# Patient Record
Sex: Male | Born: 2000 | Race: White | Hispanic: No | Marital: Single | Smoking: Current every day smoker
Health system: Southern US, Community
[De-identification: ages and names within clinical notes are randomized; demographics above are authoritative.]

## PROBLEM LIST (undated history)

## (undated) HISTORY — PX: WRIST SURGERY: SHX841

---
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
## 2018-09-27 ENCOUNTER — Ambulatory Visit (HOSPITAL_COMMUNITY)
Admission: EM | Admit: 2018-09-27 | Discharge: 2018-09-27 | Disposition: A | Discharge status: Home / Self Care | Payer: Medicaid Other | Attending: Family Medicine | Admitting: Family Medicine

## 2018-09-27 ENCOUNTER — Telehealth: Payer: Self-pay | Admitting: *Deleted

## 2018-09-27 ENCOUNTER — Other Ambulatory Visit: Payer: Self-pay

## 2018-09-27 ENCOUNTER — Encounter (HOSPITAL_COMMUNITY): Payer: Self-pay

## 2018-09-27 DIAGNOSIS — Z20822 Contact with and (suspected) exposure to covid-19: Secondary | ICD-10-CM

## 2018-09-27 DIAGNOSIS — R6889 Other general symptoms and signs: Secondary | ICD-10-CM | POA: Diagnosis present

## 2018-09-27 DIAGNOSIS — R112 Nausea with vomiting, unspecified: Secondary | ICD-10-CM | POA: Diagnosis not present

## 2018-09-27 DIAGNOSIS — J029 Acute pharyngitis, unspecified: Secondary | ICD-10-CM

## 2018-09-27 DIAGNOSIS — F419 Anxiety disorder, unspecified: Secondary | ICD-10-CM

## 2018-09-27 DIAGNOSIS — R509 Fever, unspecified: Secondary | ICD-10-CM | POA: Diagnosis not present

## 2018-09-27 LAB — POCT RAPID STREP A: Streptococcus, Group A Screen (Direct): NEGATIVE

## 2018-09-27 MED ORDER — HYDROXYZINE HCL 25 MG PO TABS: 25.0000 mg | ORAL_TABLET | Freq: Four times a day (QID) | ORAL | 0 refills | Status: AC | PRN

## 2018-09-27 MED ORDER — DOXYCYCLINE HYCLATE 100 MG PO CAPS: 100.0000 mg | ORAL_CAPSULE | Freq: Two times a day (BID) | ORAL | 0 refills | Status: AC

## 2018-09-27 MED ORDER — BENZONATATE 200 MG PO CAPS: 200.0000 mg | ORAL_CAPSULE | Freq: Three times a day (TID) | ORAL | 0 refills | Status: AC | PRN

## 2018-09-27 MED ORDER — ONDANSETRON 4 MG PO TBDP: 4.0000 mg | ORAL_TABLET | Freq: Three times a day (TID) | ORAL | 0 refills | Status: AC | PRN

## 2018-09-27 NOTE — ED Provider Notes (Signed)
[facility]    CSN: [id number] Arrival date & time: 09/27/18  1104      History   Chief Complaint Chief Complaint  Patient presents with  . Sore Throat  . Nausea  . Fever  . Emesis    HPI Andrew Koch is a 18 y.o. male no contributing past medical history presenting today for evaluation of URI symptoms, GI upset and fevers.  Patient has had nausea, vomiting, body aches and chills for the past 10 days-2 weeks.  He has also had associated congestion cough and sore throat.  He denies significant shortness of breath.  Has had some intermittent chest discomfort.  He has not checked his fever at home, but has had subjective fevers.  Denies any known sick contacts or known exposure to COVID.  He has been using ibuprofen 800 mg which does help his symptoms, but then they returned once ibuprofen wears off.  Recently took ibuprofen prior to arrival.  He does not take anything else for his symptoms.  Patient does admit to vaping nicotine, but denies underlying asthma or COPD.  HPI  History reviewed. No pertinent past medical history.  There are no active problems to display for this patient.   Past Surgical History:  Procedure Laterality Date  . WRIST SURGERY         Home Medications    Prior to Admission medications   Medication Sig Start Date End Date Taking? Authorizing Provider  benzonatate (TESSALON) 200 MG capsule Take 1 capsule (200 mg total) by mouth 3 (three) times daily as needed for up to 7 days for cough. 09/27/18 10/04/18  ,  C, PA-C  doxycycline (VIBRAMYCIN) 100 MG capsule Take 1 capsule (100 mg total) by mouth 2 (two) times daily. 09/27/18   ,  C, PA-C  hydrOXYzine (ATARAX/VISTARIL) 25 MG tablet Take 1 tablet (25 mg total) by mouth every 6 (six) hours as needed for anxiety. 09/27/18   ,  C, PA-C  ondansetron (ZOFRAN-ODT) 4 MG disintegrating tablet Take 1 tablet (4 mg total) by mouth every 8 (eight) hours as needed for  nausea or vomiting. 09/27/18   ,  C, PA-C  prochlorperazine (COMPAZINE) 10 MG tablet Take 1 tablet (10 mg total) by mouth every 6 (six) hours as needed for nausea or vomiting. 12/21/17 09/27/18  Willy Eddyobinson, Patrick, MD    Family History Family History  Adopted: Yes  Problem Relation Age of Onset  . Healthy Mother   . Diabetes Father     Social History Social History   Tobacco Use  . Smoking status: Current Every Day Smoker    Types: E-cigarettes  . Smokeless tobacco: Never Used  Substance Use Topics  . Alcohol use: Never    Frequency: Never  . Drug use: Never     Allergies   Patient has no known allergies.   Review of Systems Review of Systems  Constitutional: Positive for chills and fatigue. Negative for activity change, appetite change and fever.  HENT: Positive for congestion, rhinorrhea and sore throat. Negative for ear pain, sinus pressure and trouble swallowing.   Eyes: Negative for discharge and redness.  Respiratory: Positive for cough. Negative for chest tightness and shortness of breath.   Cardiovascular: Negative for chest pain.  Gastrointestinal: Positive for nausea and vomiting. Negative for abdominal pain and diarrhea.  Musculoskeletal: Positive for back pain, myalgias and neck pain.  Skin: Negative for rash.  Neurological: Negative for dizziness, light-headedness and headaches.     Physical Exam  Triage Vital Signs ED Triage Vitals  Enc Vitals Group     BP 09/27/18 1155 (!) 134/99     Pulse Rate 09/27/18 1155 89     Resp 09/27/18 1155 20     Temp 09/27/18 1155 100 F (37.8 C)     Temp Source 09/27/18 1155 Oral     SpO2 09/27/18 1155 100 %     Weight --      Height --      Head Circumference --      Peak Flow --      Pain Score 09/27/18 1151 2     Pain Loc --      Pain Edu? --      Excl. in [place]? --    No data found.  Updated Vital Signs BP (!) 134/99 (BP Location: Left Arm)   Pulse 89   Temp 100 F (37.8 C) (Oral)   Resp 20    SpO2 100%   Visual Acuity Right Eye Distance:   Left Eye Distance:   Bilateral Distance:    Right Eye Near:   Left Eye Near:    Bilateral Near:     Physical Exam Vitals signs and nursing note reviewed.  Constitutional:      Appearance: He is well-developed.  HENT:     Head: Normocephalic and atraumatic.     Ears:     Comments: Bilateral ears without tenderness to palpation of external auricle, tragus and mastoid, EAC's without erythema or swelling, TM's with good bony landmarks and cone of light. Non erythematous.     Mouth/Throat:     Comments: Oral mucosa pink and moist, no tonsillar enlargement or exudate. Posterior pharynx patent and nonerythematous, no uvula deviation or swelling. Normal phonation.  Eyes:     Conjunctiva/sclera: Conjunctivae normal.  Neck:     Musculoskeletal: Neck supple.  Cardiovascular:     Rate and Rhythm: Normal rate and regular rhythm.     Heart sounds: No murmur.  Pulmonary:     Effort: Pulmonary effort is normal. No respiratory distress.     Breath sounds: Normal breath sounds.     Comments: Breathing comfortably at rest, CTABL, no wheezing, rales or other adventitious sounds auscultated Abdominal:     Palpations: Abdomen is soft.     Tenderness: There is no abdominal tenderness.     Comments: Nontender to palpation throughout abdomen  Skin:    General: Skin is warm.     Comments: Skin feels warm and sweating underneath his sweatshirt  Neurological:     Mental Status: He is alert.      UC Treatments / Results  Labs (all labs ordered are listed, but only abnormal results are displayed) Labs Reviewed  CULTURE, GROUP A STREP [facility])  POCT RAPID STREP A    EKG None  Radiology No results found.  Procedures Procedures (including critical care time)  Medications Ordered in UC Medications - No data to display  Initial Impression / Assessment and Plan / UC Course  I have reviewed the triage vital signs and the nursing notes.   Pertinent labs & imaging results that were available during my care of the patient were reviewed by me and considered in my medical decision making (see chart for details).     Strep test negative.  Patient with URI symptoms, GI upset and fever.  No tachycardia or hypoxia, lungs clear, no significant reported shortness of breath.  Will defer chest x-ray.  Symptoms most likely COVID.  Will set up testing.  Will recommend continued symptomatic and supportive care with anti-inflammatories for fever and body aches.  Tessalon as needed for cough.  Given length of symptoms will go ahead and cover for atypicals with doxycycline.  Zofran as needed for nausea and vomiting, push fluids, slowly transition diet.  Do not suspect underlying abdominal emergency at this time.Discussed strict return precautions. Patient verbalized understanding and is agreeable with plan.  Hydroxyzine to use as needed for anxiety.  Final Clinical Impressions(s) / UC Diagnoses   Final diagnoses:  Suspected Covid-19 Virus Infection     Discharge Instructions     Someone will be in contact with you to set up COVID testing, the address is [address] Please remain quarantined at home until results return  Please continue Tylenol and ibuprofen for fevers, body aches and headache Tessalon as needed for cough every 8 hours Doxycycline twice daily for 10 days to cover atypical respiratory infections Drink plenty of fluids- pedialyte, gatorade Use Zofran every 8 hours as needed for nausea and vomiting, slowly transition diet, avoid spicy and greasy foods  Please follow-up if developing worsening cough, fevers continuing to persist and developing shortness of breath or chest pain    ED Prescriptions    Medication Sig Dispense Auth. Provider   ondansetron (ZOFRAN-ODT) 4 MG disintegrating tablet Take 1 tablet (4 mg total) by mouth every 8 (eight) hours as needed for nausea or vomiting. 20 tablet ,  C, PA-C    benzonatate (TESSALON) 200 MG capsule Take 1 capsule (200 mg total) by mouth 3 (three) times daily as needed for up to 7 days for cough. 28 capsule ,  C, PA-C   hydrOXYzine (ATARAX/VISTARIL) 25 MG tablet Take 1 tablet (25 mg total) by mouth every 6 (six) hours as needed for anxiety. 12 tablet ,  C, PA-C   doxycycline (VIBRAMYCIN) 100 MG capsule Take 1 capsule (100 mg total) by mouth 2 (two) times daily. 20 capsule ,  C, PA-C     Controlled Substance Prescriptions [place] Controlled Substance Registry consulted? Not Applicable   Lew Dawes,  C, [place]-C 09/27/18 1306

## 2018-09-27 NOTE — Telephone Encounter (Signed)
-----   Message from Janith Lima, PA-C sent at 09/27/2018 12:28 PM EDT ----- Regarding: Needs COVID testing URI symptoms, cough, fever, nausea, vomiting body aches x 10 days, no known exposure

## 2018-09-27 NOTE — ED Triage Notes (Signed)
Patient presents to Urgent Care with complaints of sore throat, nausea, vomiting, chills since about 10 days ago. Patient reports he took 800mg  ibuprofen this morning around 0930. Pt states he has not been around anyone else that has been sick, has not been tested for COVID.

## 2018-09-27 NOTE — Telephone Encounter (Signed)
Patient scheduled for Covid-19 testing today @ 1:30pm @ the [facility] in Home. Instructions given.

## 2018-09-27 NOTE — Discharge Instructions (Addendum)
Someone will be in contact with you to set up COVID testing, the address is [place]. Please remain quarantined at home until results return  Please continue Tylenol and ibuprofen for fevers, body aches and headache Tessalon as needed for cough every 8 hours Doxycycline twice daily for 10 days to cover atypical respiratory infections Drink plenty of fluids- pedialyte, gatorade Use Zofran every 8 hours as needed for nausea and vomiting, slowly transition diet, avoid spicy and greasy foods  Please follow-up if developing worsening cough, fevers continuing to persist and developing shortness of breath or chest pain

## 2018-09-29 LAB — CULTURE, GROUP A STREP (THRC)

## 2018-09-29 LAB — NOVEL CORONAVIRUS, NAA: SARS-CoV-2, NAA: NOT DETECTED

## 2018-09-30 ENCOUNTER — Telehealth (HOSPITAL_COMMUNITY): Payer: Self-pay | Admitting: Emergency Medicine

## 2018-09-30 NOTE — Telephone Encounter (Signed)
Your test for COVID-19 was negative.  Please continue good preventive care measures, including:  frequent hand-washing, avoid touching your face, cover coughs/sneezes, stay out of crowds and keep a 6 foot distance from others.  If you develop fever/cough/breathlessness, please stay home for 10 days and until you have had 3 consecutive days with cough/breathlessness improving and without fever (without taking a fever reducer). Go to the nearest hospital ED tent for assessment if fever/cough/breathlessness are severe or illness seems like a threat to life.  Patient contacted and made aware of all results, all questions answered.  Pt stil having cough, had not picked up tessalon. Encouraged to pick that medicine up and to call back Monday if its not working.

## 2018-12-30 ENCOUNTER — Emergency Department (HOSPITAL_COMMUNITY)
Admission: EM | Admit: 2018-12-30 | Discharge: 2018-12-30 | Disposition: A | Discharge status: Home / Self Care | Payer: Medicaid Other | Attending: Emergency Medicine | Admitting: Emergency Medicine

## 2018-12-30 ENCOUNTER — Emergency Department (HOSPITAL_COMMUNITY): Payer: Medicaid Other

## 2018-12-30 DIAGNOSIS — N433 Hydrocele, unspecified: Secondary | ICD-10-CM | POA: Insufficient documentation

## 2018-12-30 DIAGNOSIS — N50811 Right testicular pain: Secondary | ICD-10-CM | POA: Diagnosis present

## 2018-12-30 DIAGNOSIS — N5089 Other specified disorders of the male genital organs: Secondary | ICD-10-CM | POA: Insufficient documentation

## 2018-12-30 DIAGNOSIS — F17210 Nicotine dependence, cigarettes, uncomplicated: Secondary | ICD-10-CM | POA: Diagnosis not present

## 2018-12-30 LAB — URINALYSIS, ROUTINE W REFLEX MICROSCOPIC
Bilirubin Urine: NEGATIVE
Glucose, UA: NEGATIVE mg/dL
Hgb urine dipstick: NEGATIVE
Ketones, ur: NEGATIVE mg/dL
Leukocytes,Ua: NEGATIVE
Nitrite: NEGATIVE
Protein, ur: NEGATIVE mg/dL
Specific Gravity, Urine: 1.025 (ref 1.005–1.030)
pH: 5 (ref 5.0–8.0)

## 2018-12-30 MED ORDER — NAPROXEN 500 MG PO TABS: 500.0000 mg | ORAL_TABLET | Freq: Two times a day (BID) | ORAL | 0 refills | Status: AC

## 2018-12-30 NOTE — Discharge Instructions (Signed)
I have prescribed a short course of anti-inflammatories, please take 1 tablet twice a day for the next 7 days.  The number to Dr. Oval Linsey urology is attached to your chart, please schedule an appointment for follow-up of your hydrocele.  If you experience any worsening pain, fevers, abdominal pain please return to the emergency department.

## 2018-12-30 NOTE — ED Triage Notes (Signed)
Pt sent here for [place] for right testicular swelling onset one week ago with dysuria. Reports was tested for STD's earlier this week with negative results.

## 2018-12-30 NOTE — ED Provider Notes (Signed)
[facility] EMERGENCY DEPARTMENT Provider Note   CSN: [id number] Arrival date & time: 12/30/18  0840     History   Chief Complaint Chief Complaint  Patient presents with  . Groin Swelling    HPI Andrew Koch is a 18 y.o. male.     18 y.o male with no PMH presents to the ED with a chief complaint of right testicular pain x1 week.  Patient was seen at [facility] urgent care earlier today was tested for gonorrhea and chlamydia with negative results.  He reports he was sent into the ED for further evaluation with ultrasound to rule out any acute emergency.  He reports he has had right testicular swelling for the past week, some penile swelling, no changes in his discharge.  He has not taken any medication for relieving symptoms.  Patient reports the pain is worse when sitting down, states that he was sitting out in the lobby which made his pain worse.  There are no alleviating factors.  He denies any fevers, penile discharge, prior sexual transmitted infections or abdominal pain or urinary symptoms.  The history is provided by the patient.    Past Surgical History:  Procedure Laterality Date  . WRIST SURGERY          Home Medications    Prior to Admission medications   Medication Sig Start Date End Date Taking? Authorizing Provider  doxycycline (VIBRAMYCIN) 100 MG capsule Take 1 capsule (100 mg total) by mouth 2 (two) times daily. 09/27/18   Wieters, Hallie C, PA-C  hydrOXYzine (ATARAX/VISTARIL) 25 MG tablet Take 1 tablet (25 mg total) by mouth every 6 (six) hours as needed for anxiety. 09/27/18   Wieters, Hallie C, PA-C  naproxen (NAPROSYN) 500 MG tablet Take 1 tablet (500 mg total) by mouth 2 (two) times daily for 7 days. 12/30/18 01/06/19  Claude Manges, PA-C  ondansetron (ZOFRAN-ODT) 4 MG disintegrating tablet Take 1 tablet (4 mg total) by mouth every 8 (eight) hours as needed for nausea or vomiting. 09/27/18   Wieters, Hallie C, PA-C  prochlorperazine  (COMPAZINE) 10 MG tablet Take 1 tablet (10 mg total) by mouth every 6 (six) hours as needed for nausea or vomiting. 12/21/17 09/27/18  Willy Eddy, MD    Family History Family History  Adopted: Yes  Problem Relation Age of Onset  . Healthy Mother   . Diabetes Father     Social History Social History   Tobacco Use  . Smoking status: Current Every Day Smoker    Types: E-cigarettes  . Smokeless tobacco: Never Used  Substance Use Topics  . Alcohol use: Never    Frequency: Never  . Drug use: Never     Allergies   Patient has no known allergies.   Review of Systems Review of Systems  Constitutional: Negative for chills and fever.  HENT: Negative for ear pain and sore throat.   Eyes: Negative for pain and visual disturbance.  Respiratory: Negative for cough and shortness of breath.   Cardiovascular: Negative for chest pain and palpitations.  Gastrointestinal: Negative for abdominal pain and vomiting.  Genitourinary: Positive for penile swelling and scrotal swelling. Negative for decreased urine volume, dysuria, hematuria and testicular pain.  Musculoskeletal: Negative for arthralgias and back pain.  Skin: Negative for color change and rash.  Neurological: Negative for seizures and syncope.  All other systems reviewed and are negative.    Physical Exam Updated Vital Signs BP (!) 127/59 (BP Location: Left Arm)  Pulse 91   Temp 98.5 F (36.9 C) (Oral)   Resp 18   SpO2 99%   Physical Exam Vitals signs and nursing note reviewed. Exam conducted with a chaperone present.  Constitutional:      Appearance: He is well-developed.  HENT:     Head: Normocephalic and atraumatic.  Eyes:     General: No scleral icterus.    Pupils: Pupils are equal, round, and reactive to light.  Neck:     Musculoskeletal: Normal range of motion.  Cardiovascular:     Heart sounds: Normal heart sounds.  Pulmonary:     Effort: Pulmonary effort is normal.     Breath sounds: Normal  breath sounds. No wheezing.  Chest:     Chest wall: No tenderness.  Abdominal:     General: Bowel sounds are normal. There is no distension.     Palpations: Abdomen is soft.     Tenderness: There is no abdominal tenderness.  Genitourinary:    Penis: Circumcised. No paraphimosis, erythema, discharge or lesions.      Scrotum/Testes:        Right: Tenderness, swelling and testicular hydrocele present. Varicocele not present. Right testis is descended.        Left: Swelling, testicular hydrocele or varicocele not present. Left testis is descended.     Epididymis:     Right: Normal.     Left: Normal.     Comments: Chaperone present.  Pain with palpation of the right testicle, mild swelling noted to the area.  Left testicular region appears unremarkable. Musculoskeletal:        General: No tenderness or deformity.  Skin:    General: Skin is warm and dry.  Neurological:     Mental Status: He is alert and oriented to person, place, and time.      ED Treatments / Results  Labs (all labs ordered are listed, but only abnormal results are displayed) Labs Reviewed  URINALYSIS, ROUTINE W REFLEX MICROSCOPIC    EKG None  Radiology [place] Scrotum W/doppler  Result Date: 12/30/2018 CLINICAL DATA:  Right scrotal region swelling swelling EXAM: SCROTAL ULTRASOUND DOPPLER ULTRASOUND OF THE TESTICLES TECHNIQUE: Complete ultrasound examination of the testicles, epididymis, and other scrotal structures was performed. Color and spectral Doppler ultrasound were also utilized to evaluate blood flow to the testicles. COMPARISON:  None. FINDINGS: Right testicle Measurements: 4.7 x 2.0 x 3.3 cm. No mass or microlithiasis visualized. Left testicle Measurements: 4.9 x 2.7 x 3.0 cm. No mass or microlithiasis visualized. Right epididymis:  Normal in size and appearance. Left epididymis:  Normal in size and appearance. Hydrocele: There is a small hydrocele on the right. There is no appreciable hydrocele on the left.  Varicocele:  None visualized. Pulsed Doppler interrogation of both testes demonstrates normal low resistance arterial and venous waveforms bilaterally. No evidence scrotal wall thickening or abscess. IMPRESSION: Small right sided hydrocele. Study otherwise unremarkable. No evidence of orchitis or epididymitis on either side. No testicular mass or testicular torsion on either side. Electronically Signed   By: Bretta BangWilliam  Woodruff III M.D.   On: 12/30/2018 09:35    Procedures Procedures (including critical care time)  Medications Ordered in ED Medications - No data to display   Initial Impression / Assessment and Plan / ED Course  I have reviewed the triage vital signs and the nursing notes.  Pertinent labs & imaging results that were available during my care of the patient were reviewed by me and considered in my medical decision  making (see chart for details).    Patient with no past medical history presents to the ED with complaint of right scrotal swelling, the symptoms began about a week ago.  He reports worsening of symptoms, seen at urgent care this morning at [facility] and sent to the ED for further evaluation with ultrasound.  Of note, patient had negative gonorrhea and Chlamydia testing.  UA in the ED revealed no nitrites, leukocytes, trichomonas.  An ultrasound of his scrotum was obtained which showed:  Small right sided hydrocele. Study otherwise unremarkable. No  evidence of orchitis or epididymitis on either side. No testicular  mass or testicular torsion on either side.   These results were discussed with patient, he is nontoxic-appearing, ambulating with slight discomfort.  No fevers, prior history of hydroceles or varicoceles.  Will give him referral for Dr. Thurmond Butts of urology to follow-up on an outpatient basis.  He will also go home on a short course of anti-inflammatories to help with swelling.  Patient understands and agrees with management at this time, return precautions  provided at length.  Portions of this note were generated with Lobby[employer]. Dictation errors may occur despite best attempts at proofreading.  Final Clinical Impressions(s) / ED Diagnoses   Final diagnoses:  Scrotal swelling  Right hydrocele    ED Discharge Orders         Ordered    naproxen (NAPROSYN) 500 MG tablet  2 times daily     12/30/18 1202           Janeece Fitting, PA-C 12/30/18 1221    Sherwood Gambler, MD 12/30/18 1529

## 2019-10-10 ENCOUNTER — Encounter (HOSPITAL_COMMUNITY): Payer: Self-pay | Admitting: Emergency Medicine

## 2019-10-10 ENCOUNTER — Emergency Department (HOSPITAL_COMMUNITY)
Admission: EM | Admit: 2019-10-10 | Discharge: 2019-10-10 | Discharge status: Against Medical Advice | Payer: Medicaid Other | Attending: Emergency Medicine | Admitting: Emergency Medicine

## 2019-10-10 DIAGNOSIS — F329 Major depressive disorder, single episode, unspecified: Secondary | ICD-10-CM | POA: Diagnosis not present

## 2019-10-10 DIAGNOSIS — Z5321 Procedure and treatment not carried out due to patient leaving prior to being seen by health care provider: Secondary | ICD-10-CM | POA: Diagnosis not present

## 2019-10-10 LAB — COMPREHENSIVE METABOLIC PANEL
ALT: 35 U/L (ref 0–44)
AST: 24 U/L (ref 15–41)
Albumin: 4.5 g/dL (ref 3.5–5.0)
Alkaline Phosphatase: 56 U/L (ref 38–126)
Anion gap: 8 (ref 5–15)
BUN: 9 mg/dL (ref 6–20)
CO2: 25 mmol/L (ref 22–32)
Calcium: 9.6 mg/dL (ref 8.9–10.3)
Chloride: 110 mmol/L (ref 98–111)
Creatinine, Ser: 1.27 mg/dL — ABNORMAL HIGH (ref 0.61–1.24)
GFR calc Af Amer: >60 mL/min (ref >60)
GFR calc non Af Amer: >60 mL/min (ref >60)
Glucose, Bld: 94 mg/dL (ref 70–99)
Potassium: 4.3 mmol/L (ref 3.5–5.1)
Sodium: 143 mmol/L (ref 135–145)
Total Bilirubin: 0.5 mg/dL (ref 0.3–1.2)
Total Protein: 7.1 g/dL (ref 6.5–8.1)

## 2019-10-10 LAB — RAPID URINE DRUG SCREEN, HOSP PERFORMED
Amphetamines: NOT DETECTED
Barbiturates: NOT DETECTED
Benzodiazepines: NOT DETECTED
Cocaine: NOT DETECTED
Opiates: NOT DETECTED
Tetrahydrocannabinol: POSITIVE — AB

## 2019-10-10 LAB — SALICYLATE LEVEL: Salicylate Lvl: <7 mg/dL — ABNORMAL LOW (ref 7.0–30.0)

## 2019-10-10 LAB — ETHANOL: Alcohol, Ethyl (B): <10 mg/dL

## 2019-10-10 LAB — CBC
HCT: 46.4 % (ref 39.0–52.0)
Hemoglobin: 14.4 g/dL (ref 13.0–17.0)
MCH: 25.8 pg — ABNORMAL LOW (ref 26.0–34.0)
MCHC: 31 g/dL (ref 30.0–36.0)
MCV: 83.2 fL (ref 80.0–100.0)
Platelets: 234 K/uL (ref 150–400)
RBC: 5.58 MIL/uL (ref 4.22–5.81)
RDW: 13.9 % (ref 11.5–15.5)
WBC: 10.1 K/uL (ref 4.0–10.5)
nRBC: 0 % (ref 0.0–0.2)

## 2019-10-10 LAB — ACETAMINOPHEN LEVEL: Acetaminophen (Tylenol), Serum: <10 ug/mL — ABNORMAL LOW (ref 10–30)

## 2019-10-10 NOTE — ED Triage Notes (Addendum)
Pt in w/SI - states he has dealt w/depression x 3 yrs, and this is his lowest point. Arrives voluntarily w/social worker Enid Derry [phone]), and brother. Endorses that he cut his hands last night w/razors to harm self, minimal scars present. No HI/AH/VH. States he's been having some problems with his girlfriend

## 2019-10-10 NOTE — ED Notes (Signed)
Pt states he is leaving °

## 2021-02-18 IMAGING — US US SCROTUM W/ DOPPLER COMPLETE
1 series · 14 of 25 positions shown · non-contrast
Comparison: None.

CLINICAL DATA: Right scrotal region swelling swelling

EXAM:
SCROTAL ULTRASOUND
DOPPLER ULTRASOUND OF THE TESTICLES
TECHNIQUE: Complete ultrasound examination of the testicles, epididymis, and
other scrotal structures was performed. Color and spectral Doppler
ultrasound were also utilized to evaluate blood flow to the
testicles.

[Series 1: us scrotum w/ doppler complete · 14 of 69 slices shown]
[im 1/69]
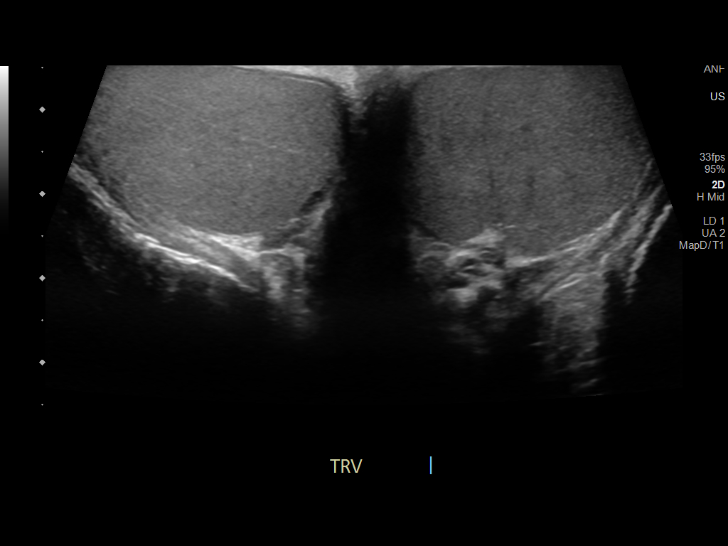
[im 6/69]
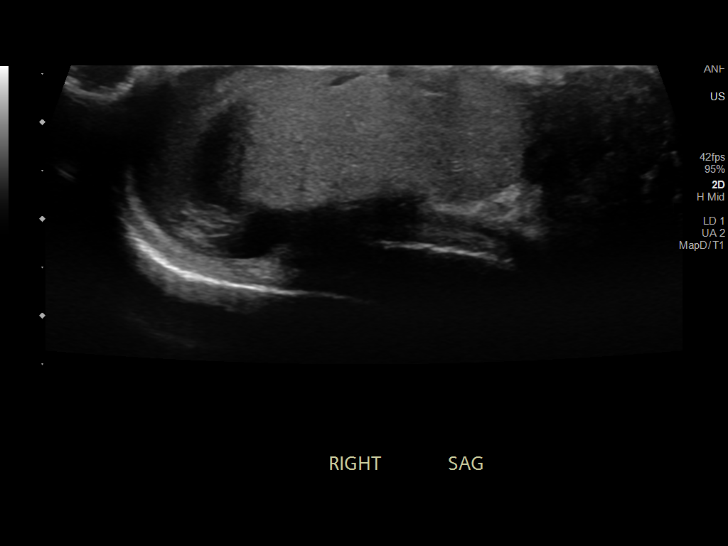
[im 12/69]
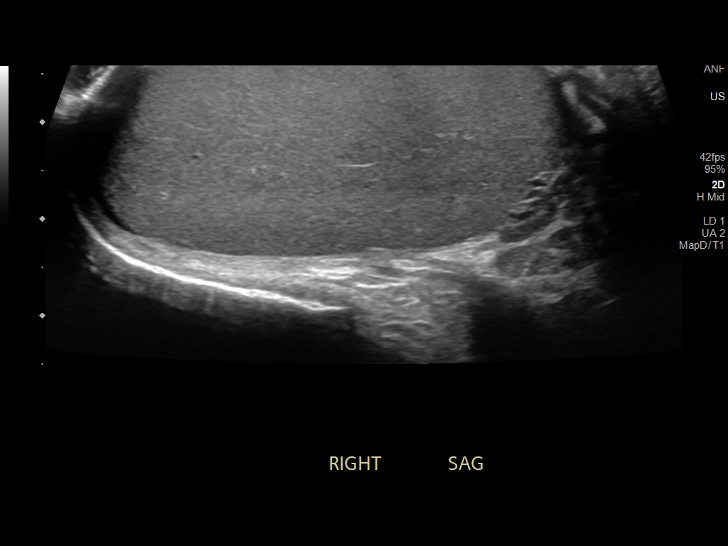
[im 18/69]
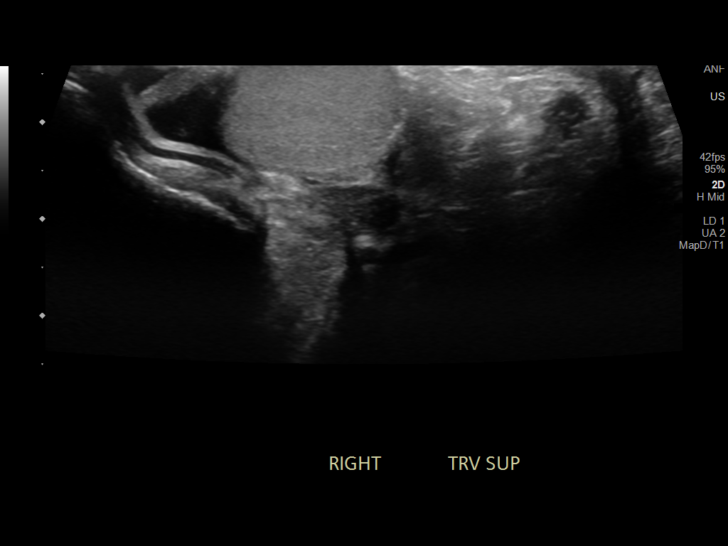
[im 23/69]
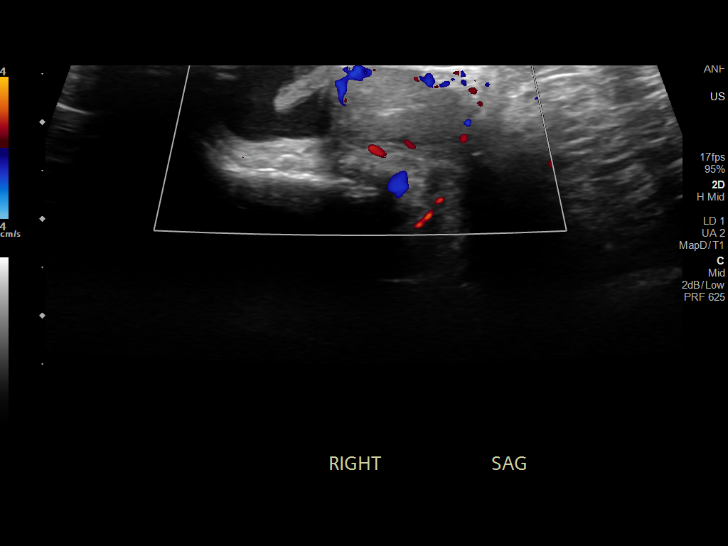
[im 26/69]
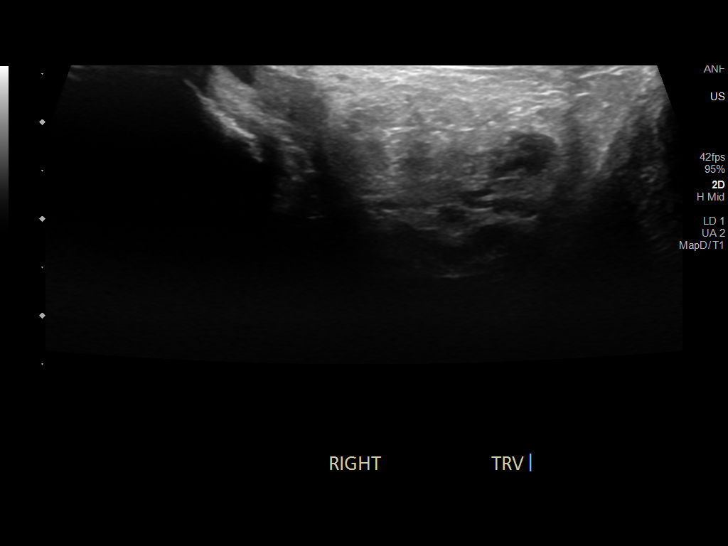
[im 32/69]
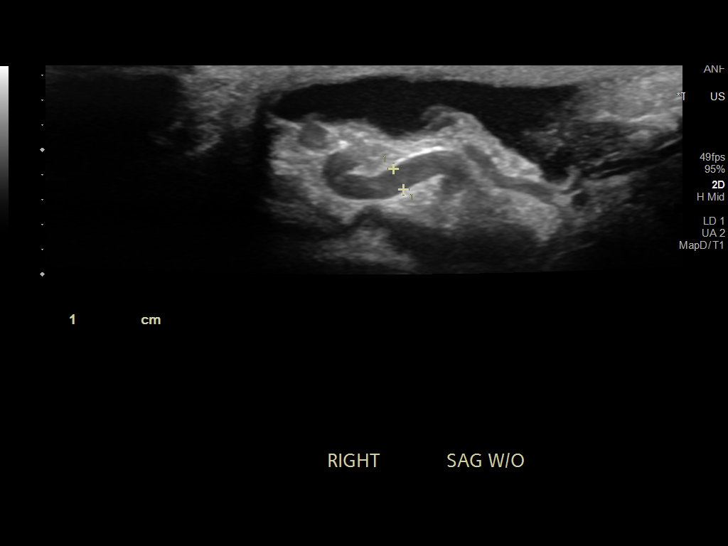
[im 37/69]
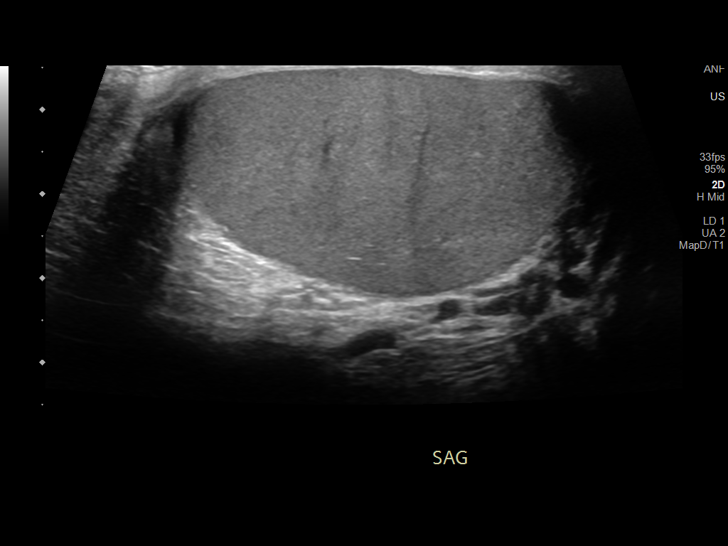
[im 43/69]
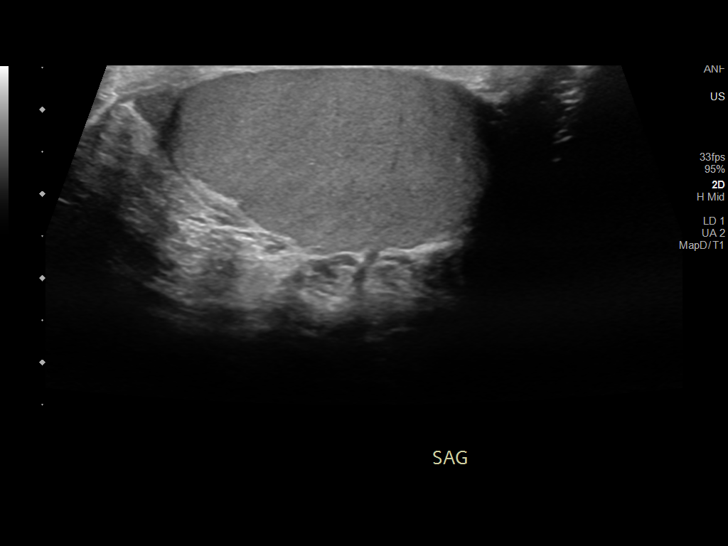
[im 46/69]
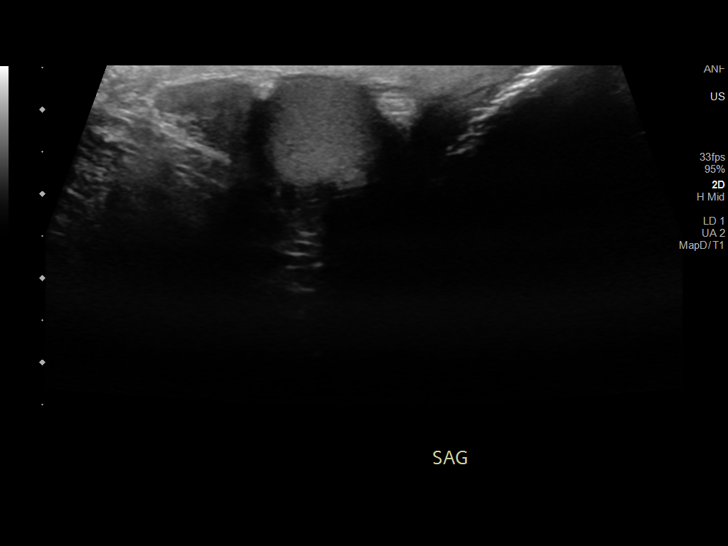
[im 52/69]
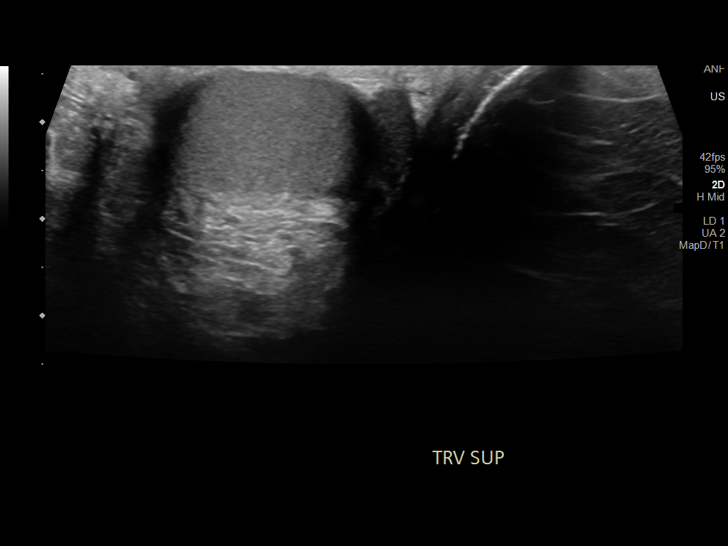
[im 57/69]
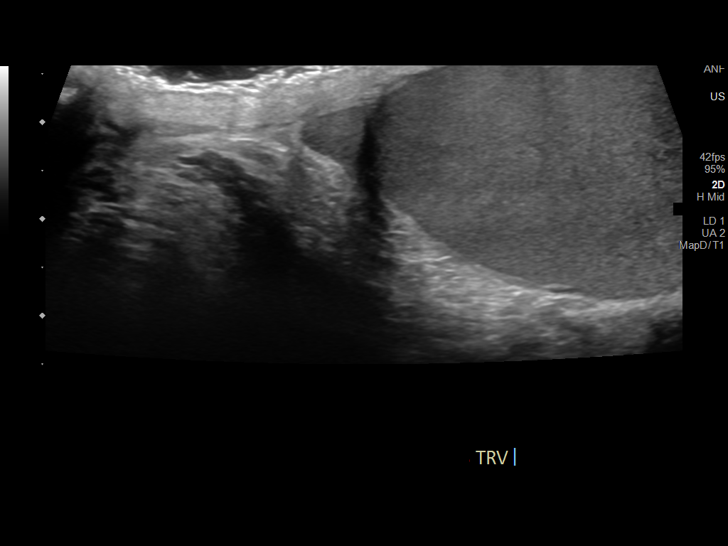
[im 63/69]
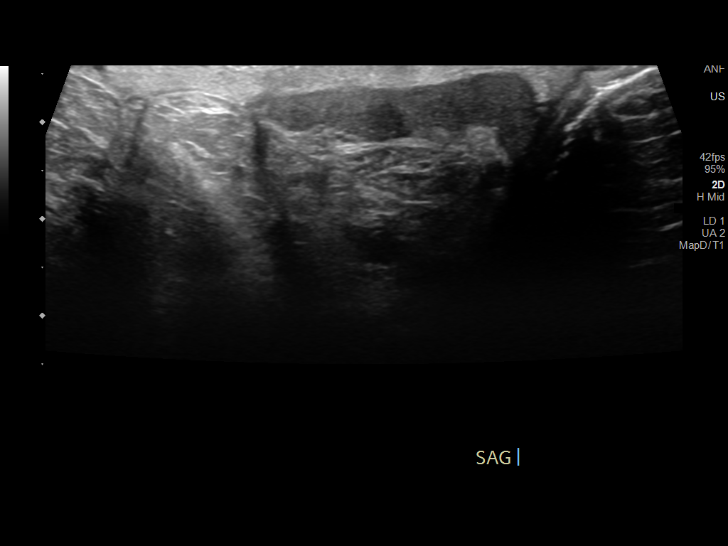
[im 69/69]
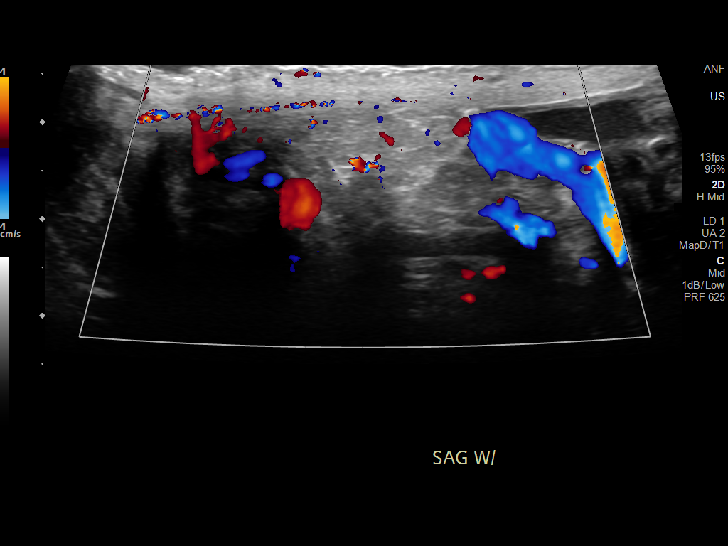

[14 of 25 positions shown; findings below may reference images not displayed]

FINDINGS: Right testicle

Measurements: 4.7 x 2.0 x 3.3 cm. No mass or microlithiasis
visualized.

Left testicle

Measurements: 4.9 x 2.7 x 3.0 cm. No mass or microlithiasis
visualized.

Right epididymis:  Normal in size and appearance.

Left epididymis:  Normal in size and appearance.

Hydrocele: There is a small hydrocele on the right. There is no
appreciable hydrocele on the left.

Varicocele:  None visualized.

Pulsed Doppler interrogation of both testes demonstrates normal low
resistance arterial and venous waveforms bilaterally.

No evidence scrotal wall thickening or abscess.
IMPRESSION: Small right sided hydrocele. Study otherwise unremarkable. No
evidence of orchitis or epididymitis on either side. No testicular
mass or testicular torsion on either side.
